# Patient Record
Sex: Male | Born: 1970 | Race: White | Hispanic: No | Marital: Married | Smoking: Never smoker
Health system: Southern US, Community
[De-identification: ages and names within clinical notes are randomized; demographics above are authoritative.]

## PROBLEM LIST (undated history)

## (undated) DIAGNOSIS — K5792 Diverticulitis of intestine, part unspecified, without perforation or abscess without bleeding: Secondary | ICD-10-CM

## (undated) HISTORY — PX: MOUTH SURGERY: SHX715

---

## 2002-08-14 ENCOUNTER — Emergency Department (HOSPITAL_COMMUNITY): Admission: EM | Admit: 2002-08-14 | Discharge: 2002-08-14 | Payer: Self-pay | Admitting: Emergency Medicine

## 2015-08-20 ENCOUNTER — Encounter: Payer: Self-pay | Admitting: *Deleted

## 2015-08-20 ENCOUNTER — Emergency Department
Admission: EM | Admit: 2015-08-20 | Discharge: 2015-08-20 | Disposition: A | Discharge status: Home / Self Care | Payer: Self-pay | Attending: Emergency Medicine | Admitting: Emergency Medicine

## 2015-08-20 ENCOUNTER — Emergency Department: Payer: Self-pay

## 2015-08-20 DIAGNOSIS — J209 Acute bronchitis, unspecified: Secondary | ICD-10-CM

## 2015-08-20 DIAGNOSIS — H6502 Acute serous otitis media, left ear: Secondary | ICD-10-CM

## 2015-08-20 MED ORDER — PREDNISONE 20 MG PO TABS: 40.0000 mg | ORAL_TABLET | Freq: Every day | ORAL | Status: DC

## 2015-08-20 MED ORDER — AZITHROMYCIN 250 MG PO TABS: ORAL_TABLET | ORAL | Status: DC

## 2015-08-20 NOTE — ED Provider Notes (Signed)
CSN: [id number]     Arrival date & time 08/20/15  1627 History   First MD Initiated Contact with Patient 08/20/15 1711     Chief Complaint  Patient presents with  . Otalgia     (Consider location/radiation/quality/duration/timing/severity/associated sxs/prior Treatment) HPI  44 year old male presents to the emergency department for evaluation of cough congestion runny nose for the last week. Earlier today he developed left ear pain. Patient also developed subjective fever earlier this morning upon awakening. He denies any headaches, nausea, vomiting, diarrhea, chest pain, shortness of breath. He has had a productive cough. He has been taking Robitussin.   History reviewed. No pertinent past medical history. History reviewed. No pertinent past surgical history. No family history on file. Social History  Substance Use Topics  . Smoking status: Never Smoker   . Smokeless tobacco: None  . Alcohol Use: No    Review of Systems  Constitutional: Negative.  Negative for fever, chills, activity change and appetite change.  HENT: Positive for ear pain, postnasal drip and sore throat. Negative for congestion, mouth sores, rhinorrhea, sinus pressure and trouble swallowing.   Eyes: Negative for photophobia, pain and discharge.  Respiratory: Positive for cough. Negative for chest tightness and shortness of breath.   Cardiovascular: Negative for chest pain and leg swelling.  Gastrointestinal: Negative for nausea, vomiting, abdominal pain, diarrhea and abdominal distention.  Genitourinary: Negative for dysuria and difficulty urinating.  Musculoskeletal: Negative for back pain, arthralgias and gait problem.  Skin: Negative for color change and rash.  Neurological: Negative for dizziness and headaches.  Hematological: Negative for adenopathy.  Psychiatric/Behavioral: Negative for behavioral problems and agitation.      Allergies  Review of patient's allergies indicates no known  allergies.  Home Medications   Prior to Admission medications   Medication Sig Start Date End Date Taking? Authorizing Provider  azithromycin (ZITHROMAX Z-PAK) 250 MG tablet Take 2 tablets (500 mg) on  Day 1,  followed by 1 tablet (250 mg) once daily on Days 2 through 5. 08/20/15   Evon Slack, PA-C  predniSONE (DELTASONE) 20 MG tablet Take 2 tablets (40 mg total) by mouth daily. 08/20/15   Evon Slack, PA-C   BP 129/93 mmHg  Pulse 87  Temp(Src) 98.4 F (36.9 C) (Oral)  Resp 18  Ht  (1.676 m)  Wt 81.647 kg  BMI 29.07 kg/m2  SpO2 100% Physical Exam  Constitutional: He is oriented to person, place, and time. He appears well-developed and well-nourished.  HENT:  Head: Normocephalic and atraumatic.  Right Ear: Hearing, external ear and ear canal normal. Tympanic membrane is injected and bulging. Tympanic membrane is not perforated and not erythematous. A middle ear effusion is present.  Left Ear: Hearing, external ear and ear canal normal. Tympanic membrane is injected and bulging. Tympanic membrane is not perforated and not erythematous. A middle ear effusion is present.  Eyes: Conjunctivae and EOM are normal. Pupils are equal, round, and reactive to light.  Neck: Normal range of motion. Neck supple.  Cardiovascular: Normal rate, regular rhythm, normal heart sounds and intact distal pulses.   Pulmonary/Chest: Effort normal and breath sounds normal. No respiratory distress. He has no wheezes. He has no rales. He exhibits no tenderness.  Abdominal: Soft. Bowel sounds are normal. He exhibits no distension. There is no tenderness.  Musculoskeletal: Normal range of motion. He exhibits no edema or tenderness.  Neurological: He is alert and oriented to person, place, and time.  Skin: Skin is warm and dry.  Psychiatric: He has a normal mood and affect. His behavior is normal. Judgment and thought content normal.    ED Course  Procedures (including critical care time) Labs  Review Labs Reviewed - No data to display  Imaging Review Dg Chest 2 View  08/20/2015  CLINICAL DATA:  Cough and earache for several days. EXAM: CHEST  2 VIEW COMPARISON:  None. FINDINGS: The heart size and mediastinal contours are within normal limits. Both lungs are clear. The visualized skeletal structures are unremarkable. IMPRESSION: No active cardiopulmonary disease. Electronically Signed   By: Myles RosenthalJohn  Stahl M.D.   On: 08/20/2015 17:34   I have personally reviewed and evaluated these images and lab results as part of my medical decision-making.   EKG Interpretation None      MDM   Final diagnoses:  Acute bronchitis, unspecified organism  Acute serous otitis media of left ear, recurrence not specified    1. Azithromycin 500 mg by mouth times one day followed by 250 mg by mouth daily for 4 days 2. Prednisone 40 mg daily for 5 days  3. continue to increase fluids 4. [facility]4 days if no improvement. Return to the ER for any worsening symptoms or urgent changes in her health    Evon Slackhomas C Edyth Glomb, PA-C 08/20/15 1843  Jennye MoccasinBrian S Quigley, MD 08/20/15 [phone]

## 2015-08-20 NOTE — Discharge Instructions (Signed)
Acute Bronchitis °Bronchitis is inflammation of the airways that extend from the windpipe into the lungs (bronchi). The inflammation often causes mucus to develop. This leads to a cough, which is the most common symptom of bronchitis.  °In acute bronchitis, the condition usually develops suddenly and goes away over time, usually in a couple weeks. Smoking, allergies, and asthma can make bronchitis worse. Repeated episodes of bronchitis may cause further lung problems.  °CAUSES °Acute bronchitis is most often caused by the same virus that causes a cold. The virus can spread from person to person (contagious) through coughing, sneezing, and touching contaminated objects. °SIGNS AND SYMPTOMS  °· Cough.   °· Fever.   °· Coughing up mucus.   °· Body aches.   °· Chest congestion.   °· Chills.   °· Shortness of breath.   °· Sore throat.   °DIAGNOSIS  °Acute bronchitis is usually diagnosed through a physical exam. Your health care provider will also ask you questions about your medical history. Tests, such as chest X-rays, are sometimes done to rule out other conditions.  °TREATMENT  °Acute bronchitis usually goes away in a couple weeks. Oftentimes, no medical treatment is necessary. Medicines are sometimes given for relief of fever or cough. Antibiotic medicines are usually not needed but may be prescribed in certain situations. In some cases, an inhaler may be recommended to help reduce shortness of breath and control the cough. A cool mist vaporizer may also be used to help thin bronchial secretions and make it easier to clear the chest.  °HOME CARE INSTRUCTIONS °· Get plenty of rest.   °· Drink enough fluids to keep your urine clear or pale yellow (unless you have a medical condition that requires fluid restriction). Increasing fluids may help thin your respiratory secretions (sputum) and reduce chest congestion, and it will prevent dehydration.   °· Take medicines only as directed by your health care provider. °· If  you were prescribed an antibiotic medicine, finish it all even if you start to feel better. °· Avoid smoking and secondhand smoke. Exposure to cigarette smoke or irritating chemicals will make bronchitis worse. If you are a smoker, consider using nicotine gum or skin patches to help control withdrawal symptoms. Quitting smoking will help your lungs heal faster.   °· Reduce the chances of another bout of acute bronchitis by washing your hands frequently, avoiding people with cold symptoms, and trying not to touch your hands to your mouth, nose, or eyes.   °· Keep all follow-up visits as directed by your health care provider.   °SEEK MEDICAL CARE IF: °Your symptoms do not improve after 1 week of treatment.  °SEEK IMMEDIATE MEDICAL CARE IF: °· You develop an increased fever or chills.   °· You have chest pain.   °· You have severe shortness of breath. °· You have bloody sputum.   °· You develop dehydration. °· You faint or repeatedly feel like you are going to pass out. °· You develop repeated vomiting. °· You develop a severe headache. °MAKE SURE YOU:  °· Understand these instructions. °· Will watch your condition. °· Will get help right away if you are not doing well or get worse. °  °This information is not intended to replace advice given to you by your health care provider. Make sure you discuss any questions you have with your health care provider. °  °Document Released: 09/17/2004 Document Revised: 08/31/2014 Document Reviewed: 01/31/2013 °Elsevier Interactive Patient Education ©2016 [employer] °Otitis Media With Effusion °Otitis media with effusion is the presence of fluid in the middle ear. This is   a common problem in children, which often follows ear infections. It may be present for weeks or longer after the infection. Unlike an acute ear infection, otitis media with effusion refers only to fluid behind the ear drum and not infection. Children with repeated ear and sinus infections and allergy problems  are the most likely to get otitis media with effusion. °CAUSES  °The most frequent cause of the fluid buildup is dysfunction of the eustachian tubes. These are the tubes that drain fluid in the ears to the back of the nose (nasopharynx). °SYMPTOMS  °· The main symptom of this condition is hearing loss. As a result, you or your child may: °¨ Listen to the TV at a loud volume. °¨ Not respond to questions. °¨ Ask "what" often when spoken to. °¨ Mistake or confuse one sound or word for another. °· There may be a sensation of fullness or pressure but usually not pain. °DIAGNOSIS  °· Your health care provider will diagnose this condition by examining you or your child's ears. °· Your health care provider may test the pressure in you or your child's ear with a tympanometer. °· A hearing test may be conducted if the problem persists. °TREATMENT  °· Treatment depends on the duration and the effects of the effusion. °· Antibiotics, decongestants, nose drops, and cortisone-type drugs (tablets or nasal spray) may not be helpful. °· Children with persistent ear effusions may have delayed language or behavioral problems. Children at risk for developmental delays in hearing, learning, and speech may require referral to a specialist earlier than children not at risk. °· You or your child's health care provider may suggest a referral to an ear, nose, and throat surgeon for treatment. The following may help restore normal hearing: °¨ Drainage of fluid. °¨ Placement of ear tubes (tympanostomy tubes). °¨ Removal of adenoids (adenoidectomy). °HOME CARE INSTRUCTIONS  °· Avoid secondhand smoke. °· Infants who are breastfed are less likely to have this condition. °· Avoid feeding infants while they are lying flat. °· Avoid known environmental allergens. °· Avoid people who are sick. °SEEK MEDICAL CARE IF:  °· Hearing is not better in 3 months. °· Hearing is worse. °· Ear pain. °· Drainage from the ear. °· Dizziness. °MAKE SURE YOU:   °· Understand these instructions. °· Will watch your condition. °· Will get help right away if you are not doing well or get worse. °  °This information is not intended to replace advice given to you by your health care provider. Make sure you discuss any questions you have with your health care provider. °  °Document Released: 09/17/2004 Document Revised: 08/31/2014 Document Reviewed: 03/07/2013 °Elsevier Interactive Patient Education ©2016 Elsevier Inc. ° °

## 2015-08-20 NOTE — ED Notes (Signed)
Pt discharged home after verbalizing understanding of discharge instructions; nad noted. 

## 2015-08-20 NOTE — ED Notes (Signed)
Patient reports right ear pain last night and noticed drainage. Patient c/o left ear pain today. Patient c/o cough today and being ill last week.

## 2017-05-15 IMAGING — CR DG CHEST 2V
2 series · 2 of 2 positions shown · non-contrast
Comparison: None.

CLINICAL DATA: Cough and earache for several days.

EXAM:
CHEST  2 VIEW

[chest pa]
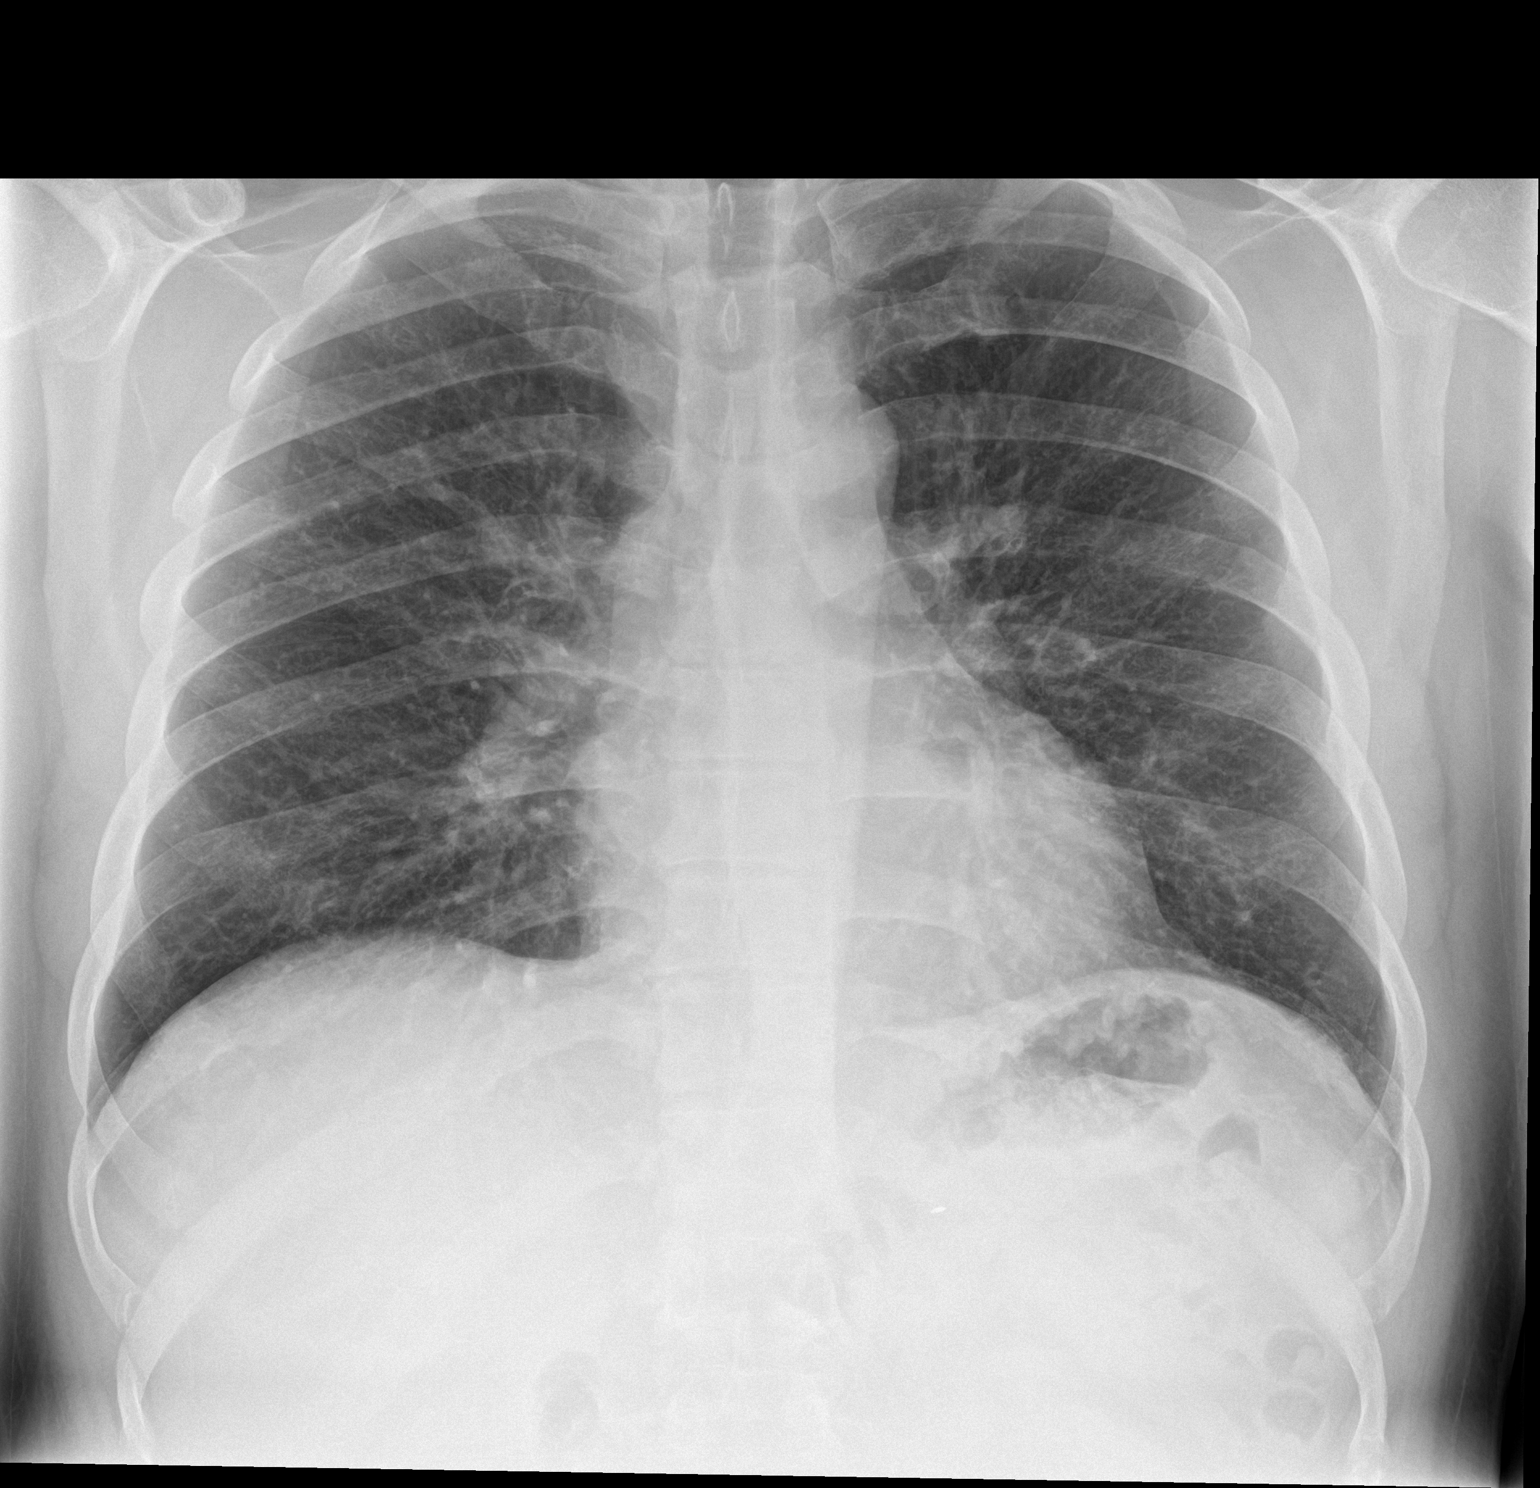

[chest lat]
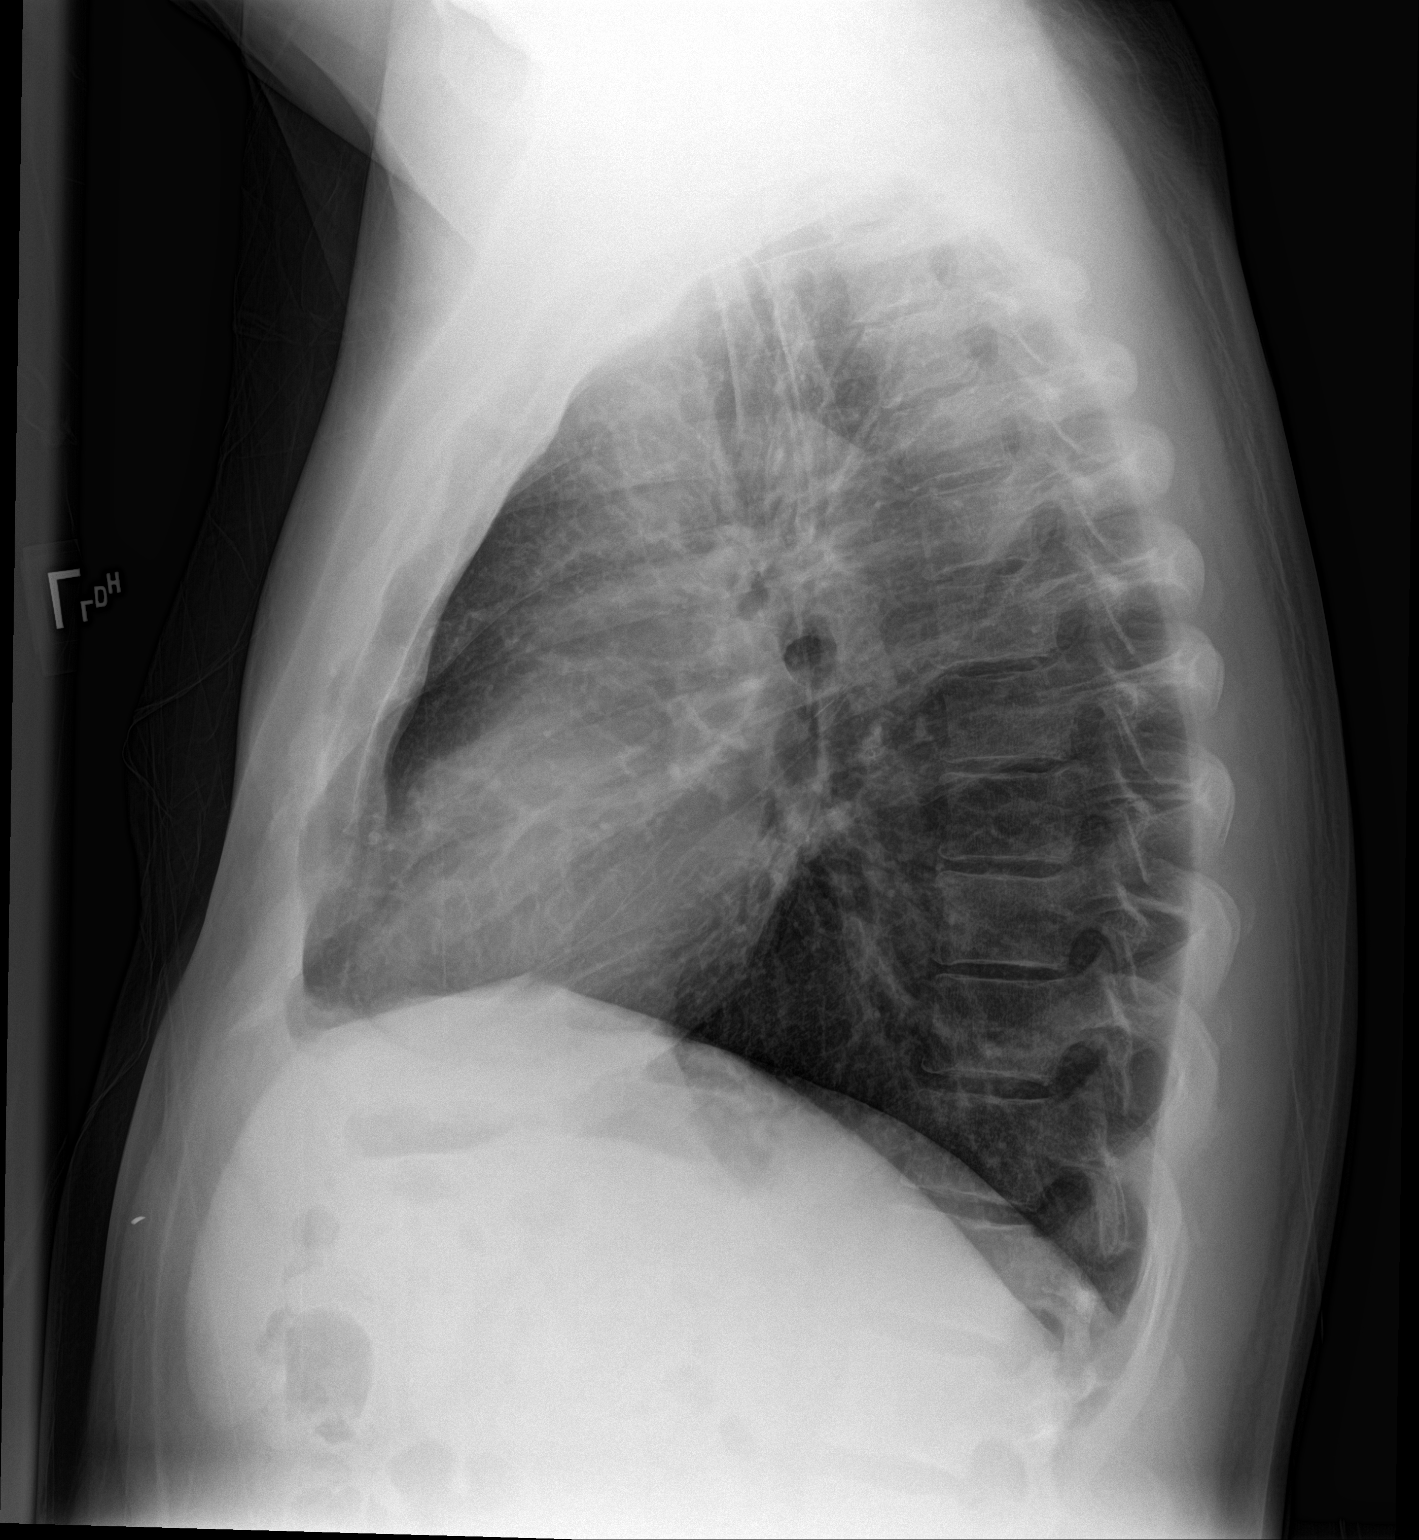

[2 of 2 positions shown; findings below may reference images not displayed]

FINDINGS: The heart size and mediastinal contours are within normal limits.
Both lungs are clear. The visualized skeletal structures are
unremarkable.
IMPRESSION: No active cardiopulmonary disease.

## 2019-08-22 ENCOUNTER — Ambulatory Visit
Admission: EM | Admit: 2019-08-22 | Discharge: 2019-08-22 | Disposition: A | Discharge status: Home / Self Care | Payer: Self-pay | Attending: Family Medicine | Admitting: Family Medicine

## 2019-08-22 ENCOUNTER — Encounter: Payer: Self-pay | Admitting: Emergency Medicine

## 2019-08-22 ENCOUNTER — Other Ambulatory Visit: Payer: Self-pay

## 2019-08-22 DIAGNOSIS — R5383 Other fatigue: Secondary | ICD-10-CM

## 2019-08-22 DIAGNOSIS — M25562 Pain in left knee: Secondary | ICD-10-CM

## 2019-08-22 DIAGNOSIS — R519 Headache, unspecified: Secondary | ICD-10-CM

## 2019-08-22 MED ORDER — DICLOFENAC SODIUM 75 MG PO TBEC: 75.0000 mg | DELAYED_RELEASE_TABLET | Freq: Two times a day (BID) | ORAL | 0 refills | Status: AC | PRN

## 2019-08-22 NOTE — Discharge Instructions (Signed)
Rest.  Fluids.  Medication as needed.  If fatigue persists, we can see you or you can get yourself a PCP.  Take care  Dr. Lacinda Axon

## 2019-08-22 NOTE — ED Provider Notes (Signed)
[facility]    CSN: [id number] Arrival date & time: 08/22/19  0827      History   Chief Complaint Chief Complaint  Patient presents with  . Headache  . Fatigue  . Knee Pain    left   HPI  48 year old male presents with the above complaints.  Patient states that he has not been feeling well for the past 2 weeks.  He reports frequent headaches.  They can last for days at a time.  He describes the pain as dull.  Located in the bitemporal region and behind the eyes.  He states that he has had some photophobia and some nausea.  No vomiting.  No known relieving factors.  Patient also reports ongoing fatigue.  Patient states that he just feels like he does not have any energy despite getting adequate rest.  No recent illness.  He does not have a primary care physician.  Additionally, patient reports ongoing left knee pain.  He reports recent left knee pain and swelling.  Has now improved this past week.  He has pain with certain twisting movements of the knee.  Rates his pain as 7/10 in severity.  Patient denies fall, trauma, injury.  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  There is is likely contributing to his knee pain.  No other associated symptoms.  No other complaints.  PMH, Surgical Hx, Family Hx, Social History reviewed and updated as below.  No significant PMH.  Past Surgical History:  Procedure Laterality Date  . MOUTH SURGERY      Home Medications    Prior to Admission medications   Medication Sig Start Date End Date Taking? Authorizing Provider  diclofenac (VOLTAREN) 75 MG EC tablet Take 1 tablet (75 mg total) by mouth 2 (two) times daily as needed for moderate pain (Headache). 08/22/19   Coral Spikes, DO    Family History Family History  Problem Relation Age of Onset  . Brain cancer Mother   . Other Father        unknown medical history    Social History Social History   Tobacco Use  .  Smoking status: Never Smoker  . Smokeless tobacco: Never Used  Substance Use Topics  . Alcohol use: No  . Drug use: Never     Allergies   Patient has no known allergies.   Review of Systems Review of Systems  Constitutional: Positive for fatigue.  Musculoskeletal:       Left knee pain.  Neurological: Positive for headaches.   Physical Exam Triage Vital Signs ED Triage Vitals [08/22/19 0841]  Enc Vitals Group     BP (!) 114/96     Pulse Rate 78     Resp 18     Temp 97.8 F (36.6 C)     Temp Source Oral     SpO2 100 %     Weight 165 lb (74.8 kg)     Height 5\' 6"  (1.676 m)     Head Circumference      Peak Flow      Pain Score 7     Pain Loc      Pain Edu?      Excl. in [place]?    Updated Vital Signs BP (!) 114/96 (BP Location: Left Arm)   Pulse 78   Temp 97.8 F (36.6 C) (Oral)   Resp 18   Ht 5\' 6"  (1.676 m)  Wt 74.8 kg   SpO2 100%   BMI 26.63 kg/m   Visual Acuity Right Eye Distance:   Left Eye Distance:   Bilateral Distance:    Right Eye Near:   Left Eye Near:    Bilateral Near:     Physical Exam Vitals and nursing note reviewed.  Constitutional:      General: He is not in acute distress.    Appearance: Normal appearance. He is not ill-appearing.  HENT:     Head: Normocephalic and atraumatic.  Eyes:     General:        Right eye: No discharge.        Left eye: No discharge.     Conjunctiva/sclera: Conjunctivae normal.  Cardiovascular:     Rate and Rhythm: Normal rate and regular rhythm.     Heart sounds: No murmur.  Pulmonary:     Effort: Pulmonary effort is normal.     Breath sounds: Normal breath sounds. No wheezing, rhonchi or rales.  Musculoskeletal:     Comments: Left knee - No appreciable swelling/effusion.  No discrete areas of tenderness.  Ligaments intact.  Neurological:     Mental Status: He is alert.  Psychiatric:        Mood and Affect: Mood normal.        Behavior: Behavior normal.    UC Treatments / Results  Labs (all  labs ordered are listed, but only abnormal results are displayed) Labs Reviewed - No data to display  EKG   Radiology No results found.  Procedures Procedures (including critical care time)  Medications Ordered in UC Medications - No data to display  Initial Impression / Assessment and Plan / UC Course  I have reviewed the triage vital signs and the nursing notes.  Pertinent labs & imaging results that were available during my care of the patient were reviewed by me and considered in my medical decision making (see chart for details).    48 year old male presents with multiple complaints.  Regarding ongoing fatigue, this is acute.  He appears well.  We discussed work-up and we elected to not proceed with laboratory studies and further work-up due to lack of insurance and cost.  Knee pain currently improved.  No appreciable swelling at this time.  Diclofenac as needed.  Regarding his ongoing headaches, I am also treating him with diclofenac.  Supportive care.  Follow-up with [place] or primary as needed.  Final Clinical Impressions(s) / UC Diagnoses   Final diagnoses:  Acute pain of left knee  Fatigue, unspecified type  Acute nonintractable headache, unspecified headache type     Discharge Instructions     Rest.  Fluids.  Medication as needed.  If fatigue persists, we can see you or you can get yourself a PCP.  Take care  Dr. Adriana Simas    ED Prescriptions    Medication Sig Dispense Auth. Provider   diclofenac (VOLTAREN) 75 MG EC tablet Take 1 tablet (75 mg total) by mouth 2 (two) times daily as needed for moderate pain (Headache). 30 tablet Tommie Sams, DO     PDMP not reviewed this encounter.   Everlene Other [place], [place] 08/22/19 [id number]

## 2019-08-22 NOTE — ED Triage Notes (Signed)
Patient in today c/o headache and fatigue off & on x 2 weeks. Patient also c/o left knee pain x 2 weeks. Patient states he thinks he twisted his knee.

## 2022-02-18 ENCOUNTER — Other Ambulatory Visit: Payer: Self-pay

## 2022-02-18 ENCOUNTER — Emergency Department
Admission: EM | Admit: 2022-02-18 | Discharge: 2022-02-18 | Discharge status: Against Medical Advice | Payer: Self-pay | Attending: Emergency Medicine | Admitting: Emergency Medicine

## 2022-02-18 ENCOUNTER — Encounter: Payer: Self-pay | Admitting: Emergency Medicine

## 2022-02-18 DIAGNOSIS — R197 Diarrhea, unspecified: Secondary | ICD-10-CM | POA: Insufficient documentation

## 2022-02-18 DIAGNOSIS — Z5321 Procedure and treatment not carried out due to patient leaving prior to being seen by health care provider: Secondary | ICD-10-CM | POA: Insufficient documentation

## 2022-02-18 DIAGNOSIS — R112 Nausea with vomiting, unspecified: Secondary | ICD-10-CM | POA: Insufficient documentation

## 2022-02-18 DIAGNOSIS — R1032 Left lower quadrant pain: Secondary | ICD-10-CM | POA: Insufficient documentation

## 2022-02-18 HISTORY — DX: Diverticulitis of intestine, part unspecified, without perforation or abscess without bleeding: K57.92

## 2022-02-18 NOTE — ED Triage Notes (Signed)
Patient ambulatory to triage with steady gait, without difficulty or distress noted; pt reports left lower abd pain that began on Saturday accomp by N/V/D; st symptoms resolved but then reocurred; st hx diverticulitis
